# Patient Record
Sex: Male | Born: 1960 | Race: White | Hispanic: No | Marital: Single | Smoking: Current every day smoker
Health system: Southern US, Community
[De-identification: ages and names within clinical notes are randomized; demographics above are authoritative.]

## PROBLEM LIST (undated history)

## (undated) DIAGNOSIS — E7800 Pure hypercholesterolemia, unspecified: Secondary | ICD-10-CM

## (undated) HISTORY — PX: HERNIA REPAIR: SHX51

## (undated) HISTORY — PX: BACK SURGERY: SHX140

---

## 2004-11-29 ENCOUNTER — Encounter: Admission: RE | Admit: 2004-11-29 | Discharge: 2004-11-29 | Payer: Self-pay | Admitting: Occupational Medicine

## 2005-03-13 ENCOUNTER — Ambulatory Visit: Payer: Self-pay | Admitting: General Surgery

## 2009-10-02 ENCOUNTER — Ambulatory Visit: Payer: Self-pay

## 2009-10-15 ENCOUNTER — Encounter: Admission: RE | Admit: 2009-10-15 | Discharge: 2009-10-15 | Payer: Self-pay | Admitting: Unknown Physician Specialty

## 2009-11-04 ENCOUNTER — Ambulatory Visit: Payer: Self-pay | Admitting: Unknown Physician Specialty

## 2009-11-05 ENCOUNTER — Ambulatory Visit: Payer: Self-pay | Admitting: Unknown Physician Specialty

## 2010-10-29 ENCOUNTER — Ambulatory Visit: Payer: Self-pay | Admitting: Unknown Physician Specialty

## 2011-08-25 ENCOUNTER — Other Ambulatory Visit: Payer: Self-pay | Admitting: Family Medicine

## 2011-08-25 DIAGNOSIS — M5410 Radiculopathy, site unspecified: Secondary | ICD-10-CM

## 2011-08-25 DIAGNOSIS — R29898 Other symptoms and signs involving the musculoskeletal system: Secondary | ICD-10-CM

## 2011-08-31 ENCOUNTER — Ambulatory Visit
Admission: RE | Admit: 2011-08-31 | Discharge: 2011-08-31 | Disposition: A | Discharge status: Home / Self Care | Payer: BC Managed Care – PPO | Source: Ambulatory Visit | Attending: Family Medicine | Admitting: Family Medicine

## 2011-08-31 DIAGNOSIS — R29898 Other symptoms and signs involving the musculoskeletal system: Secondary | ICD-10-CM

## 2011-08-31 DIAGNOSIS — M5410 Radiculopathy, site unspecified: Secondary | ICD-10-CM

## 2011-08-31 MED ORDER — GADOBENATE DIMEGLUMINE 529 MG/ML IV SOLN
19.0000 mL | Freq: Once | INTRAVENOUS | Status: AC | PRN
  Administered 2011-08-31: 19 mL via INTRAVENOUS

## 2013-01-22 ENCOUNTER — Other Ambulatory Visit: Payer: Self-pay | Admitting: Occupational Medicine

## 2013-01-22 ENCOUNTER — Ambulatory Visit: Payer: Self-pay

## 2013-01-22 DIAGNOSIS — Z0000 Encounter for general adult medical examination without abnormal findings: Secondary | ICD-10-CM

## 2014-04-15 ENCOUNTER — Ambulatory Visit: Admit: 2014-04-15 | Disposition: A | Discharge status: Home / Self Care | Payer: Self-pay | Admitting: Otolaryngology

## 2014-04-15 LAB — URINALYSIS, COMPLETE
BACTERIA: NONE SEEN
BILIRUBIN, UR: NEGATIVE
Glucose,UR: NEGATIVE mg/dL (ref 0–75)
KETONE: NEGATIVE
Leukocyte Esterase: NEGATIVE
Nitrite: NEGATIVE
PH: 5 (ref 4.5–8.0)
Protein: NEGATIVE
RBC,UR: 1 /HPF (ref 0–5)
Specific Gravity: 1.019 (ref 1.003–1.030)
Squamous Epithelial: NONE SEEN
WBC UR: <1 /HPF (ref 0–5)

## 2014-04-15 LAB — COMPREHENSIVE METABOLIC PANEL
AST: 31 U/L
Albumin: 4.3 g/dL
Alkaline Phosphatase: 59 U/L
Anion Gap: 6 — ABNORMAL LOW (ref 7–16)
BUN: 16 mg/dL
Bilirubin,Total: 0.9 mg/dL
Calcium, Total: 9 mg/dL
Chloride: 107 mmol/L
Co2: 26 mmol/L
Creatinine: 0.84 mg/dL
EGFR (African American): >60
EGFR (Non-African Amer.): >60
Glucose: 140 mg/dL — ABNORMAL HIGH
Potassium: 3.8 mmol/L
SGPT (ALT): 33 U/L
Sodium: 139 mmol/L
Total Protein: 6.9 g/dL

## 2014-04-15 LAB — CBC
HCT: 41.7 % (ref 40.0–52.0)
HGB: 13.9 g/dL (ref 13.0–18.0)
MCH: 28.8 pg (ref 26.0–34.0)
MCHC: 33.3 g/dL (ref 32.0–36.0)
MCV: 87 fL (ref 80–100)
Platelet: 158 10*3/uL (ref 150–440)
RBC: 4.81 10*6/uL (ref 4.40–5.90)
RDW: 13.4 % (ref 11.5–14.5)
WBC: 6.3 10*3/uL (ref 3.8–10.6)

## 2014-04-15 LAB — CK TOTAL AND CKMB (NOT AT ARMC)
CK, TOTAL: 171 U/L
CK-MB: 5.7 ng/mL — ABNORMAL HIGH

## 2014-04-15 LAB — TROPONIN I: Troponin-I: <0.03 ng/mL

## 2014-04-21 ENCOUNTER — Ambulatory Visit
Admission: RE | Admit: 2014-04-21 | Discharge: 2014-04-21 | Disposition: A | Discharge status: Home / Self Care | Payer: Self-pay | Source: Ambulatory Visit | Attending: Internal Medicine | Admitting: Internal Medicine

## 2014-04-21 ENCOUNTER — Other Ambulatory Visit: Payer: Self-pay | Admitting: Internal Medicine

## 2014-04-21 DIAGNOSIS — S20219A Contusion of unspecified front wall of thorax, initial encounter: Secondary | ICD-10-CM

## 2014-05-17 NOTE — Op Note (Signed)
PATIENT NAME:  Herbert Flores, SPONAUGLE MR#:  [id number] DATE OF BIRTH:  [birth year]  DATE OF PROCEDURE:  04/15/2014  PREOPERATIVE DIAGNOSIS: Laceration and partial avulsion of the left auricle secondary to trauma.   POSTOPERATIVE DIAGNOSIS: Laceration and partial avulsion of the left auricle secondary to trauma.   OPERATIVE PROCEDURE: Complex repair of left auricular laceration and trauma.   SURGEON: Cammy Copa, MD.   ANESTHESIA: General.   COMPLICATIONS: None.  TOTAL ESTIMATED BLOOD LOSS: 25 mL.   PROCEDURE: The patient has blunt trauma to his left auricle secondary to a fall from a tree and has a through-and-through laceration and tore the skin. The skin anteriorly is quite macerated and there is avulsion of some of the anterior skin just above the earlobe. The skin has very stellate tears inferiorly and goes through the entire cartilage from the conchal bowl all the way to the superior outer auricle. The laceration involves the entire posterior side of the auricle from the left outer superior down to behind the conchal bowl inferiorly. The cartilage is shattered in several areas with small little chunks. Some of these are loose and trimmed. There was a small amount of debris and this was all washed clear. No local anesthesia was placed. Tried to figure out where the cartilage pieces went and piece things back together after he was asleep and prepped and draped in sterile fashion.   Once I could tell where the cartilage went, some 5-0 Vicryl was used in small interrupted sutures to piece the cartilage back together and hold the underlying structure of the auricle in its anatomic position. You could tell with piecing the skin back where it belonged, that there was avulsion of skin inferiorly on the wound right at the inferior border of the concha bullosa just above the earlobe. Once the cartilage was all pieced together using the 5-0 Vicryl, a skin flap was created of some of the conchal mucosa so  that I could rotate this laterally and use it to cover the defect. The 5-0 Vicryls were used to hold the tension of the skin of the flaps in position and these were used to close the dermis. The 5-0 Vicryl was used to close dermis further up the wound and to hold the outer edge of the helix together in its normal anatomic position.   Once I had this all pieced together with 5-0 Vicryl, then 6-0 Prolene was used in interrupted sutures along the advancement flap, and then in a running locking suture to hold the skin edges together along the main vertical portion of the incision from the bottom of the conchal bowl all the way up to the superior auricle. At the superior auricle, several small interrupted sutures were placed to make sure that the outer edge of the auricle looked normal, and then a running locking suture was placed on the backside of the auricle to hold the skin edges together there.  Once the wound was completely closed, it was covered with some bacitracin ointment and then Telfa. Cotton ball was placed into the conchal bowl and then a Glasscock ear dressing was placed over the auricle. A piece of gauze was placed behind the auricle to hold it along with Telfa there.  The patient tolerated the procedure well. He was awakened and taken to the recovery room in satisfactory condition. There were no operative complications.   ____________________________ Cammy Copa, MD phj:jh D: 04/15/2014 19:42:46 ET T: 04/15/2014 20:27:12 ET JOB#: [id number]  cc: Beverly Sessions.  Elenore RotaJuengel, MD, <Dictator> Cammy CopaPAUL H Shaquilla Kehres MD ELECTRONICALLY SIGNED 04/19/2014 17:46

## 2014-05-17 NOTE — H&P (Signed)
PATIENT NAME:  Herbert Flores, [name] MR#:  [id number] DATE OF BIRTH:  November 15, 1960  DATE OF ADMISSION:  04/15/2014  EMERGENCY ROOM NOTE AND HISTORY AND PHYSICAL   CHIEF COMPLAINT: Left traumatic ear laceration and partial avulsion.   HISTORY OF PRESENT ILLNESS: The patient was brought to the Emergency Room at 9:30 this morning after having a 15-foot fall. He was in a tree trying to trim a limb and fell into some mulch and dirt. He landed on his left ear with a very complex laceration and partial avulsion of the lower portion of the ear. He did not lose consciousness. He was evaluated in the Emergency Room and had x-rays of his head and neck with no injuries to his cervical spine or head. He has some contusions and pain around his chest, but no obvious fractures. He is seen for evaluation of his ear and repair.   REVIEW OF SYSTEMS: He has had some low back pain previously and some numbness down his right leg, but otherwise has no other head and neck complaints.   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 He has had back surgery previously, and he is on medication for that. He has also had hernia repair and vasectomy. He has elevated cholesterol, for which he is on medication.   CURRENT MEDICATIONS: As reviewed in the chart. He is on gabapentin, Lipitor, nabumetone, and tramadol.   DRUG ALLERGIES: None known.  SOCIAL HISTORY: He does smoke.   FAMILY HISTORY: Negative for any medical issues.   PHYSICAL EXAMINATION: GENERAL: The patient is awake and alert, remembers the incidents and seems to be a good historian.  HEENT: His left ear shows a complex laceration through and through, running tangentially from the upper 1/3 of the down through the conchal bowl and the lower half of the ear is hanging inferiorly. The cartilage is torn and has very irregular border and small pieces that are broken. The skin is torn all the way through the back of the ear, back to the postauricular  crease. The ear canal is not involved, and his hearing is okay on this side. The right ear is clear. He has a little bruising in his neck, beneath it on the left side, but no neck masses palpable, is slightly tender there. His nose is clear. His oropharynx is clear.  LUNGS: Clear to auscultation. He is a little tender at his sternum and some of his ribs when he takes a deep breath.  ABDOMEN: Benign, but somewhat tender to palpation.  HEART: Shows a regular rate and rhythm without murmur. His heart rate is about 60.   IMPRESSION: The patient has a very irregular tear/laceration of the ear where the skin and cartilage  is disrupted. There are no good sharp edges, but a very irregular wound. This will require a complex repair, and I think this is best to be done in the operating room. He has had CT scans of his head and neck that showed no signs of fracture.   PLAN: We will plan to repair this under anesthesia, once it is repaired, he should be able to be discharged home to rest at home and follow up in the office.   I have explained this to the patient, and he understands and has no further questions. Informed surgical request was signed,     ____________________________ Cammy CopaPaul H. Vihaan Gloss, MD phj:mw D: 04/15/2014 12:51:41 ET T: 04/15/2014 13:08:28 ET JOB#: [id number]  cc: Cammy CopaPaul H. Jakaylee Sasaki, MD, <Dictator> Renae FicklePAUL  Ova Freshwater MD ELECTRONICALLY SIGNED 04/19/2014 17:46

## 2015-02-01 ENCOUNTER — Encounter: Payer: Self-pay | Admitting: Endocrinology

## 2015-02-01 ENCOUNTER — Ambulatory Visit (INDEPENDENT_AMBULATORY_CARE_PROVIDER_SITE_OTHER): Payer: BLUE CROSS/BLUE SHIELD | Admitting: Endocrinology

## 2015-02-01 VITALS — BP 120/78 | HR 69 | Temp 98.2°F | Ht 74.5 in | Wt 224.0 lb

## 2015-02-01 DIAGNOSIS — E785 Hyperlipidemia, unspecified: Secondary | ICD-10-CM | POA: Insufficient documentation

## 2015-02-01 DIAGNOSIS — E0590 Thyrotoxicosis, unspecified without thyrotoxic crisis or storm: Secondary | ICD-10-CM

## 2015-02-01 LAB — LIPID PANEL
Cholesterol: 259 mg/dL — ABNORMAL HIGH (ref 0–200)
HDL: 40.6 mg/dL (ref >39.00)
NonHDL: 218.79
Total CHOL/HDL Ratio: 6
Triglycerides: 255 mg/dL — ABNORMAL HIGH (ref 0.0–149.0)
VLDL: 51 mg/dL — ABNORMAL HIGH (ref 0.0–40.0)

## 2015-02-01 LAB — T4, FREE: Free T4: 0.68 ng/dL (ref 0.60–1.60)

## 2015-02-01 LAB — TSH: TSH: 0.93 u[IU]/mL (ref 0.35–4.50)

## 2015-02-01 NOTE — Progress Notes (Signed)
Subjective:    Patient ID: Herbert Flores, male    DOB: 05/24/1960, 55 y.o.   MRN: [id number]  HPI Pt states few mos of slight tremor of the hands, and assoc fatigue.  He has never been on thyroid therapy.  He has never had XRT to the anterior neck, or thyroid surgery.  He has never had thyroid imaging.  He does not consume kelp or any other non-prescribed thyroid medication.  He has never been on amiodarone.  Pt also requests we check his lipids (PCP office was closed last week, for snow).   No past medical history on file.  No past surgical history on file.  Social History   Social History  . Marital Status: Single    Spouse Name: N/A  . Number of Children: N/A  . Years of Education: N/A   Occupational History  . Not on file.   Social History Main Topics  . Smoking status: Current Every Day Smoker  . Smokeless tobacco: Not on file  . Alcohol Use: 0.0 oz/week    0 Standard drinks or equivalent per week  . Drug Use: Not on file  . Sexual Activity: Not on file   Other Topics Concern  . Not on file   Social History Narrative  . No narrative on file    No current outpatient prescriptions on file prior to visit.   No current facility-administered medications on file prior to visit.    Allergies  Allergen Reactions  . Lipitor [Atorvastatin]     Joint Pain    Family History  Problem Relation Age of Onset  . Thyroid disease Mother   . Thyroid disease Sister     BP 120/78 mmHg  Pulse 69  Temp(Src) 98.2 F (36.8 C) (Oral)  Ht 6' 2.5" (1.892 m)  Wt 224 lb (101.606 kg)  BMI 28.38 kg/m2  SpO2 96%  Review of Systems denies headache, hoarseness, visual loss, palpitations, sob, diarrhea, polyuria, excessive diaphoresis, heat intolerance, easy bruising, and rhinorrhea. He has itching, insomnia, lightheadedness, redness of the eyes, anxiety, and myalgias.  He has lost a few lbs.      Objective:   Physical Exam VS: see vs page GEN: no distress HEAD: head: no  deformity eyes: no periorbital swelling, no proptosis external nose and ears are normal mouth: no lesion seen NECK: supple, thyroid is not enlarged CHEST WALL: no deformity LUNGS: clear to auscultation.   BREASTS:  No gynecomastia CV: reg rate and rhythm, no murmur ABD: abdomen is soft, nontender.  no hepatosplenomegaly.  not distended.  no hernia.   MUSCULOSKELETAL: muscle bulk and strength are grossly normal.  no obvious joint swelling.  gait is normal and steady EXTEMITIES: no deformity.  no edema PULSES: no carotid bruit NEURO:  cn 2-12 grossly intact.   readily moves all 4's.  sensation is intact to touch on all 4's.  SKIN:  Normal texture and temperature.  No rash or suspicious lesion is visible.   NODES:  None palpable at the neck PSYCH: alert, well-oriented.  Does not appear anxious nor depressed.  I have reviewed outside records, and summarized: Pt was noted to have suppressed TSH, and referred here.  outside test results are reviewed: TSH=0.08  Today: Lab Results  Component Value Date   TSH 0.93 02/01/2015   CXR (01/22/13): no mention is made of a goiter.     Assessment & Plan:  Hyperthyroidism, new, uncertain etiology, resolved.  I advised recheck in 1 month.  Patient is advised the following: Patient Instructions  blood tests are requested for you today.  We'll let you know about the results. If it is still high, please consider the options we discussed today, and let us know.      Hyperthyroidism Hyperthyroidism is when the thyroid is too active (overactive). Your thyroid is a large gland that is located in your neck. The thyroid helps to control how your body uses food (metabolism). When your thyroid is overactive, it produces too much of a hormone called thyroxine.  CAUSES Causes of hyperthyroidism may include:  Graves disease. This is when your immune system attacks the thyroid gland. This is the most common cause.  Inflammation of the thyroid  gland.  Tumor in the thyroid gland or somewhere else.  Excessive use of thyroid medicines, including:  Prescription thyroid supplement.  Herbal supplements that mimic thyroid hormones.  Solid or fluid-filled lumps within your thyroid gland (thyroid nodules).  Excessive ingestion of iodine. RISK FACTORS  Being male.  Having a family history of thyroid conditions. SIGNS AND SYMPTOMS Signs and symptoms of hyperthyroidism may include:  Nervousness.  Inability to tolerate heat.  Unexplained weight loss.  Diarrhea.  Change in the texture of hair or skin.  Heart skipping beats or making extra beats.  Rapid heart rate.  Loss of menstruation.  Shaky hands.  Fatigue.  Restlessness.  Increased appetite.  Sleep problems.  Enlarged thyroid gland or nodules. DIAGNOSIS  Diagnosis of hyperthyroidism may include:  Medical history and physical exam.  Blood tests.  Ultrasound tests. TREATMENT Treatment may include:  Medicines to control your thyroid.  Surgery to remove your thyroid.  Radiation therapy. HOME CARE INSTRUCTIONS   Take medicines only as directed by your health care provider.  Do not use any tobacco products, including cigarettes, chewing tobacco, or electronic cigarettes. If you need help quitting, ask your health care provider.  Do not exercise or do physical activity until your health care provider approves.  Keep all follow-up appointments as directed by your health care provider. This is important. SEEK MEDICAL CARE IF:  Your symptoms do not get better with treatment.  You have fever.  You are taking thyroid replacement medicine and you:  Have depression.  Feel mentally and physically slow.  Have weight gain. SEEK IMMEDIATE MEDICAL CARE IF:   You have decreased alertness or a change in your awareness.  You have abdominal pain.  You feel dizzy.  You have a rapid heartbeat.  You have an irregular heartbeat.   This  information is not intended to replace advice given to you by your health care provider. Make sure you discuss any questions you have with your health care provider.   Document Released: 01/02/2005 Document Revised: 01/23/2014 Document Reviewed: 05/20/2013 Elsevier Interactive Patient Education [employer]

## 2015-02-01 NOTE — Patient Instructions (Signed)
blood tests are requested for you today.  We'll let you know about the results. If it is still high, please consider the options we discussed today, and let us know.      Hyperthyroidism Hyperthyroidism is when the thyroid is too active (overactive). Your thyroid is a large gland that is located in your neck. The thyroid helps to control how your body uses food (metabolism). When your thyroid is overactive, it produces too much of a hormone called thyroxine.  CAUSES Causes of hyperthyroidism may include:  Graves disease. This is when your immune system attacks the thyroid gland. This is the most common cause.  Inflammation of the thyroid gland.  Tumor in the thyroid gland or somewhere else.  Excessive use of thyroid medicines, including:  Prescription thyroid supplement.  Herbal supplements that mimic thyroid hormones.  Solid or fluid-filled lumps within your thyroid gland (thyroid nodules).  Excessive ingestion of iodine. RISK FACTORS  Being male.  Having a family history of thyroid conditions. SIGNS AND SYMPTOMS Signs and symptoms of hyperthyroidism may include:  Nervousness.  Inability to tolerate heat.  Unexplained weight loss.  Diarrhea.  Change in the texture of hair or skin.  Heart skipping beats or making extra beats.  Rapid heart rate.  Loss of menstruation.  Shaky hands.  Fatigue.  Restlessness.  Increased appetite.  Sleep problems.  Enlarged thyroid gland or nodules. DIAGNOSIS  Diagnosis of hyperthyroidism may include:  Medical history and physical exam.  Blood tests.  Ultrasound tests. TREATMENT Treatment may include:  Medicines to control your thyroid.  Surgery to remove your thyroid.  Radiation therapy. HOME CARE INSTRUCTIONS   Take medicines only as directed by your health care provider.  Do not use any tobacco products, including cigarettes, chewing tobacco, or electronic cigarettes. If you need help quitting, ask your  health care provider.  Do not exercise or do physical activity until your health care provider approves.  Keep all follow-up appointments as directed by your health care provider. This is important. SEEK MEDICAL CARE IF:  Your symptoms do not get better with treatment.  You have fever.  You are taking thyroid replacement medicine and you:  Have depression.  Feel mentally and physically slow.  Have weight gain. SEEK IMMEDIATE MEDICAL CARE IF:   You have decreased alertness or a change in your awareness.  You have abdominal pain.  You feel dizzy.  You have a rapid heartbeat.  You have an irregular heartbeat.   This information is not intended to replace advice given to you by your health care provider. Make sure you discuss any questions you have with your health care provider.   Document Released: 01/02/2005 Document Revised: 01/23/2014 Document Reviewed: 05/20/2013 Elsevier Interactive Patient Education [employer]

## 2015-02-02 ENCOUNTER — Encounter: Payer: Self-pay | Admitting: Endocrinology

## 2015-02-02 LAB — LDL CHOLESTEROL, DIRECT: Direct LDL: 183 mg/dL

## 2015-03-01 ENCOUNTER — Other Ambulatory Visit (INDEPENDENT_AMBULATORY_CARE_PROVIDER_SITE_OTHER): Payer: BLUE CROSS/BLUE SHIELD

## 2015-03-01 DIAGNOSIS — E785 Hyperlipidemia, unspecified: Secondary | ICD-10-CM | POA: Diagnosis not present

## 2015-03-01 DIAGNOSIS — E0590 Thyrotoxicosis, unspecified without thyrotoxic crisis or storm: Secondary | ICD-10-CM | POA: Diagnosis not present

## 2015-03-01 LAB — TSH: TSH: 0.62 u[IU]/mL (ref 0.35–4.50)

## 2015-03-01 LAB — T4, FREE: FREE T4: 0.8 ng/dL (ref 0.60–1.60)

## 2015-06-07 DIAGNOSIS — S83242A Other tear of medial meniscus, current injury, left knee, initial encounter: Secondary | ICD-10-CM | POA: Diagnosis not present

## 2015-07-01 DIAGNOSIS — S83242D Other tear of medial meniscus, current injury, left knee, subsequent encounter: Secondary | ICD-10-CM | POA: Diagnosis not present

## 2015-10-29 ENCOUNTER — Ambulatory Visit: Payer: Self-pay

## 2015-10-29 ENCOUNTER — Other Ambulatory Visit: Payer: Self-pay | Admitting: Occupational Medicine

## 2015-10-29 DIAGNOSIS — Z0000 Encounter for general adult medical examination without abnormal findings: Secondary | ICD-10-CM

## 2015-12-13 DIAGNOSIS — Z125 Encounter for screening for malignant neoplasm of prostate: Secondary | ICD-10-CM | POA: Diagnosis not present

## 2015-12-13 DIAGNOSIS — Z1159 Encounter for screening for other viral diseases: Secondary | ICD-10-CM | POA: Diagnosis not present

## 2015-12-13 DIAGNOSIS — R5383 Other fatigue: Secondary | ICD-10-CM | POA: Diagnosis not present

## 2015-12-13 DIAGNOSIS — E7800 Pure hypercholesterolemia, unspecified: Secondary | ICD-10-CM | POA: Diagnosis not present

## 2015-12-13 DIAGNOSIS — Z0000 Encounter for general adult medical examination without abnormal findings: Secondary | ICD-10-CM | POA: Diagnosis not present

## 2015-12-13 DIAGNOSIS — R946 Abnormal results of thyroid function studies: Secondary | ICD-10-CM | POA: Diagnosis not present

## 2015-12-13 DIAGNOSIS — M519 Unspecified thoracic, thoracolumbar and lumbosacral intervertebral disc disorder: Secondary | ICD-10-CM | POA: Diagnosis not present

## 2016-05-26 IMAGING — CT CT HEAD WITHOUT CONTRAST
3 of 5 series · 15 of 47 positions shown, 18 images · non-contrast
Comparison: None.

CLINICAL DATA: Fall 15 feet from a tree. Laceration along the left
ear.

EXAM:
CT HEAD WITHOUT CONTRAST
CT CERVICAL SPINE WITHOUT CONTRAST
TECHNIQUE: Multidetector CT imaging of the head and cervical spine was
performed following the standard protocol without intravenous
contrast. Multiplanar CT image reconstructions of the cervical spine
were also generated.

[Series 8: sag bone · sagittal · 0.29mm/px · 3 of 76 slices shown]
[im 26/76  brain]
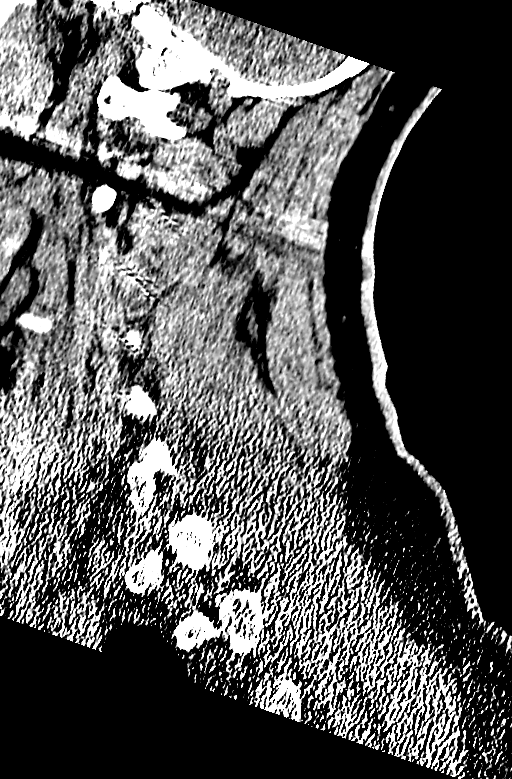
[im 38/76  brain]
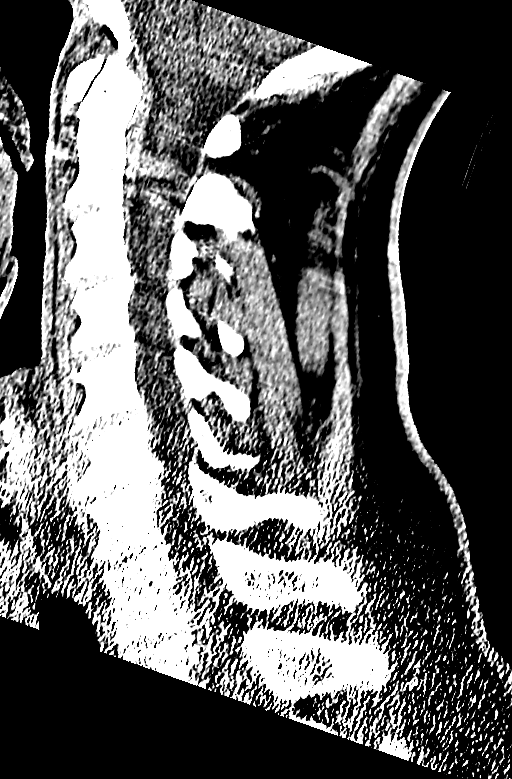
[im 51/76  brain]
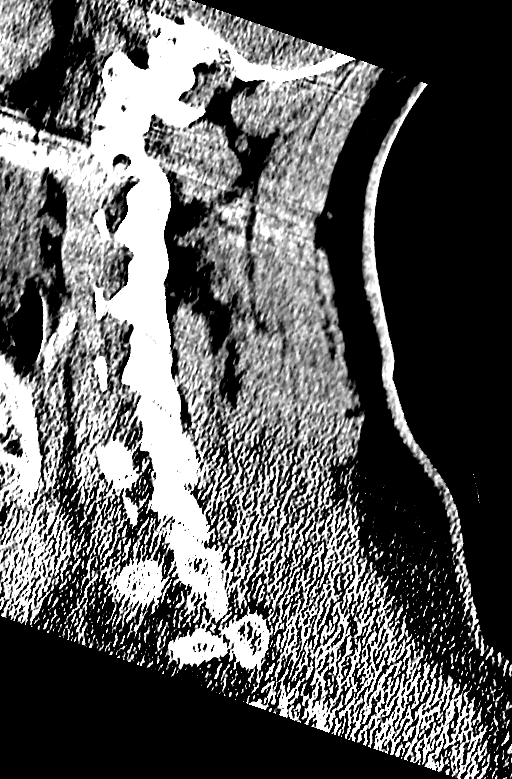

[Series 9: cor bone · coronal · 0.39mm/px · 3 of 71 slices shown]
[im 24/71  brain]
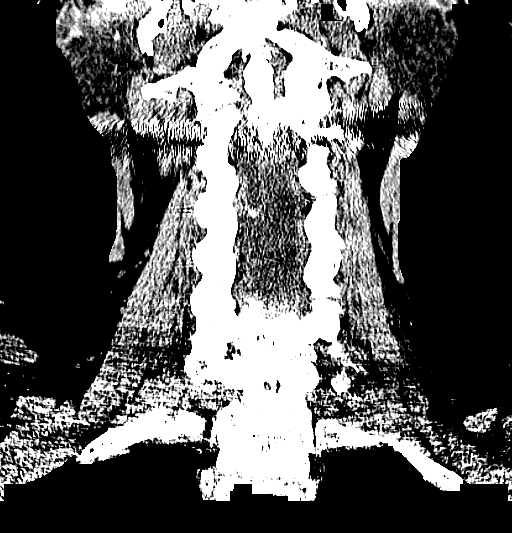
[im 32/71  brain]
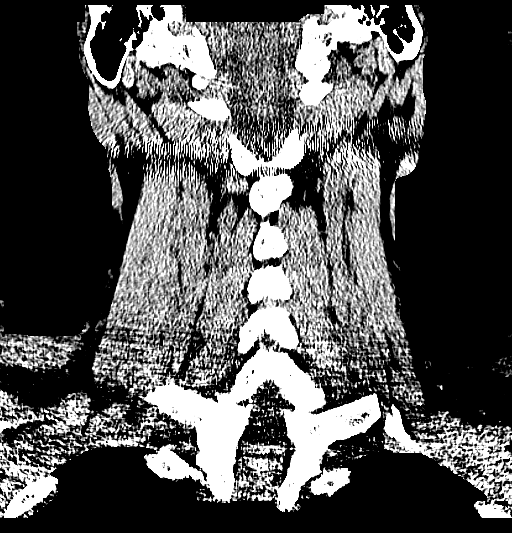
[im 39/71  brain]
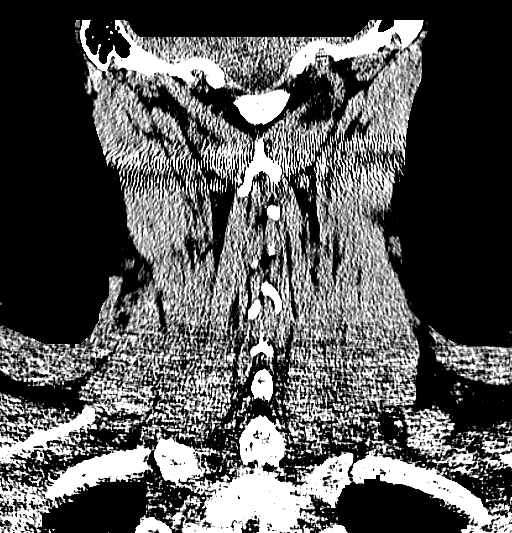

[Series 10: orthogonal axials · axial · 0.27mm/px · z∈[-379,-229]mm · 9 of 100 slices shown, 12 images]
[im 9/100  brain]
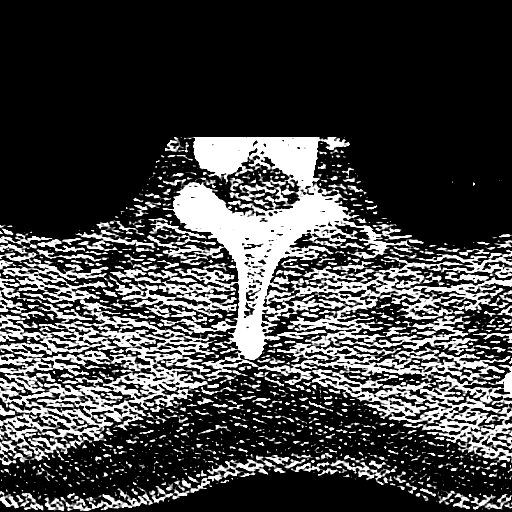
[im 9/100  bone]
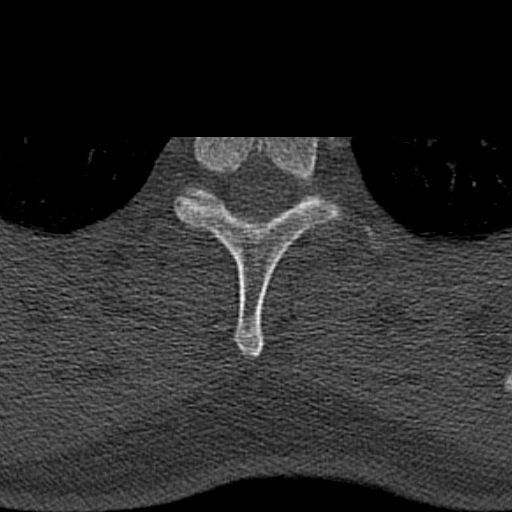
[im 17/100  brain]
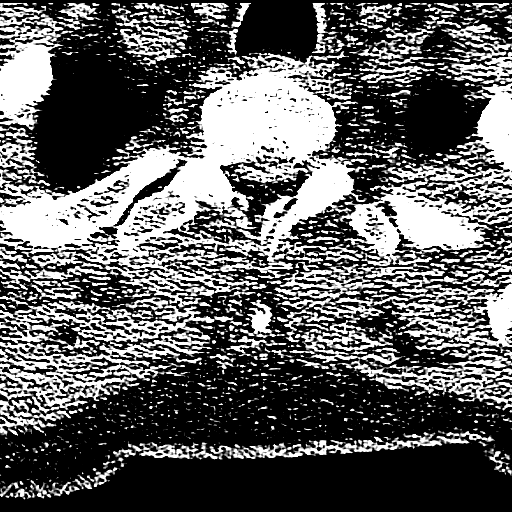
[im 34/100  brain]
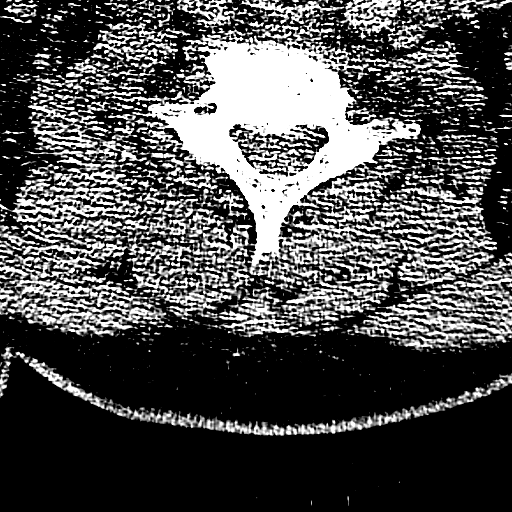
[im 42/100  brain]
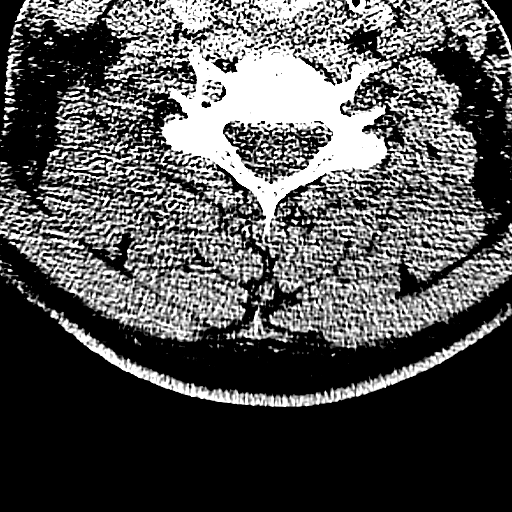
[im 50/100  brain]
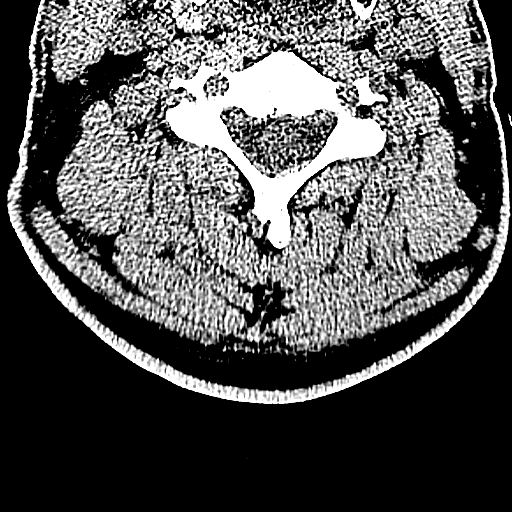
[im 50/100  bone]
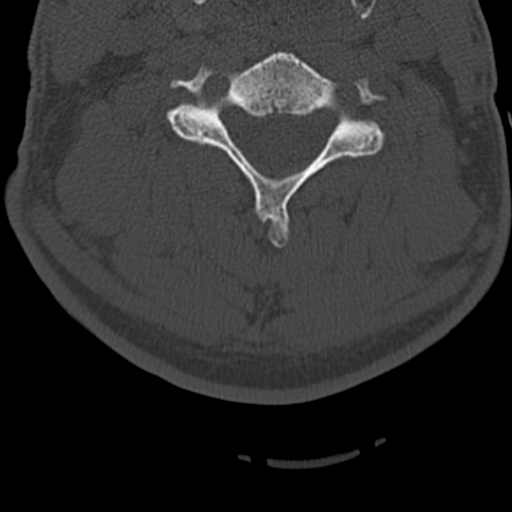
[im 58/100  brain]
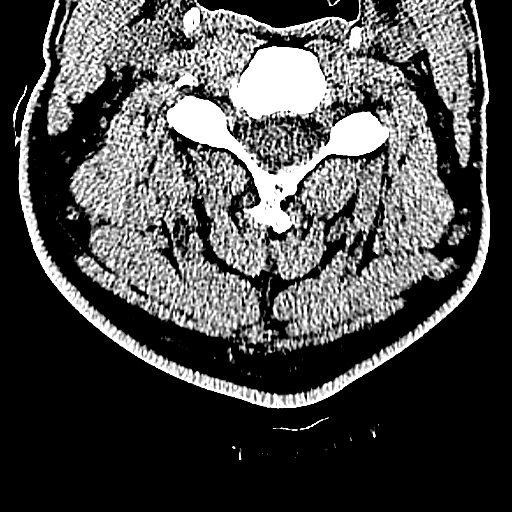
[im 67/100  brain]
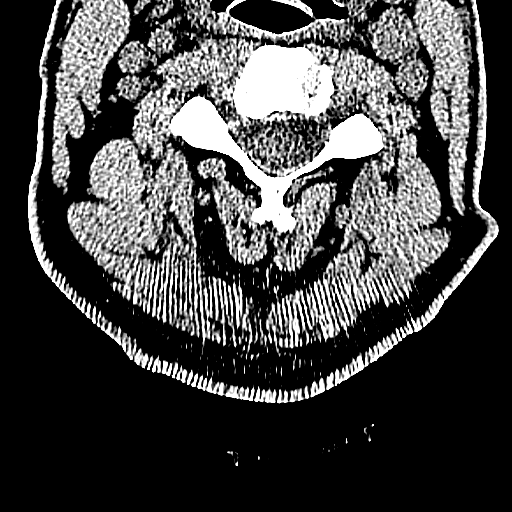
[im 83/100  brain]
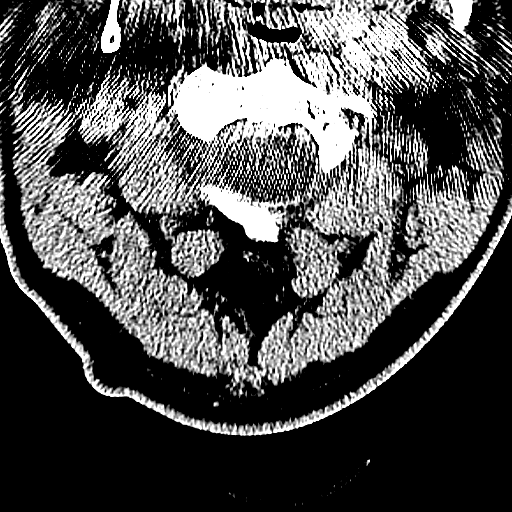
[im 91/100  brain]
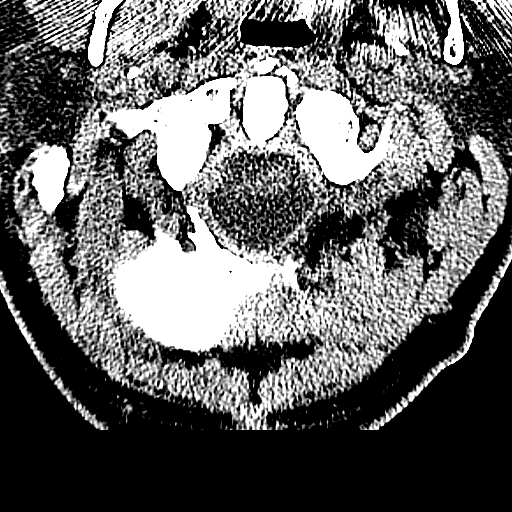
[im 91/100  bone]
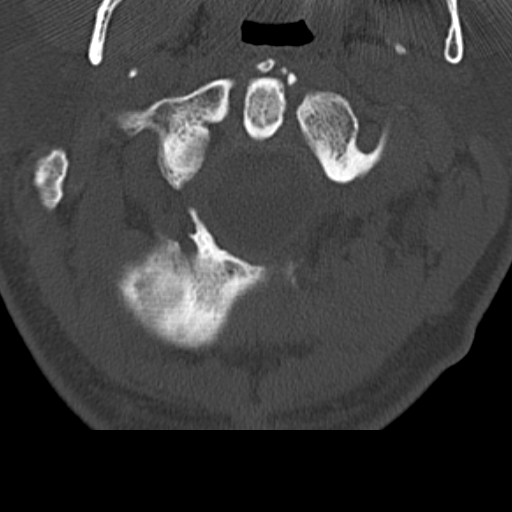

[15 of 47 positions shown; findings below may reference images not displayed]

FINDINGS: CT HEAD FINDINGS

The brainstem, cerebellum, cerebral peduncles, thalamus, basal
ganglia, basilar cisterns, and ventricular system appear within
normal limits. No intracranial hemorrhage, mass lesion, or acute
CVA.

Considerable chronic bilateral ethmoid sinusitis with bilateral
chronic frontal and sphenoid sinusitis.

CT CERVICAL SPINE FINDINGS

Posterior osseous ridging, uncinate spurring, and facet arthropathy
cause moderate to prominent left and moderate right foraminal
stenosis at C6-7.

No cervical spine fracture or subluxation is identified.
IMPRESSION: 1. No acute intracranial abnormality or acute cervical spine
abnormality.
2. Facet than uncinate spurring cause prominent left and moderate
right foraminal stenosis at C6-7.
3. Chronic bilateral frontal, sphenoid, and ethmoid sinusitis.

## 2017-12-17 DIAGNOSIS — E7800 Pure hypercholesterolemia, unspecified: Secondary | ICD-10-CM | POA: Diagnosis not present

## 2017-12-17 DIAGNOSIS — Z0000 Encounter for general adult medical examination without abnormal findings: Secondary | ICD-10-CM | POA: Diagnosis not present

## 2017-12-17 DIAGNOSIS — N200 Calculus of kidney: Secondary | ICD-10-CM | POA: Diagnosis not present

## 2017-12-17 DIAGNOSIS — Z125 Encounter for screening for malignant neoplasm of prostate: Secondary | ICD-10-CM | POA: Diagnosis not present

## 2019-04-16 DIAGNOSIS — E7800 Pure hypercholesterolemia, unspecified: Secondary | ICD-10-CM | POA: Diagnosis not present

## 2019-04-16 DIAGNOSIS — N529 Male erectile dysfunction, unspecified: Secondary | ICD-10-CM | POA: Diagnosis not present

## 2019-04-16 DIAGNOSIS — M519 Unspecified thoracic, thoracolumbar and lumbosacral intervertebral disc disorder: Secondary | ICD-10-CM | POA: Diagnosis not present

## 2019-04-16 DIAGNOSIS — Z125 Encounter for screening for malignant neoplasm of prostate: Secondary | ICD-10-CM | POA: Diagnosis not present

## 2019-04-16 DIAGNOSIS — Z1389 Encounter for screening for other disorder: Secondary | ICD-10-CM | POA: Diagnosis not present

## 2019-04-16 DIAGNOSIS — Z0000 Encounter for general adult medical examination without abnormal findings: Secondary | ICD-10-CM | POA: Diagnosis not present

## 2020-09-03 ENCOUNTER — Other Ambulatory Visit: Payer: Self-pay

## 2020-09-03 ENCOUNTER — Ambulatory Visit
Admission: RE | Admit: 2020-09-03 | Discharge: 2020-09-03 | Disposition: A | Discharge status: Home / Self Care | Payer: BC Managed Care – PPO | Source: Ambulatory Visit | Attending: Internal Medicine | Admitting: Internal Medicine

## 2020-09-03 ENCOUNTER — Other Ambulatory Visit: Payer: Self-pay | Admitting: Internal Medicine

## 2020-09-03 DIAGNOSIS — E7800 Pure hypercholesterolemia, unspecified: Secondary | ICD-10-CM | POA: Diagnosis not present

## 2020-09-03 DIAGNOSIS — M533 Sacrococcygeal disorders, not elsewhere classified: Secondary | ICD-10-CM | POA: Diagnosis not present

## 2020-09-03 DIAGNOSIS — R059 Cough, unspecified: Secondary | ICD-10-CM | POA: Diagnosis not present

## 2020-09-03 DIAGNOSIS — Z23 Encounter for immunization: Secondary | ICD-10-CM | POA: Diagnosis not present

## 2020-09-03 DIAGNOSIS — Z125 Encounter for screening for malignant neoplasm of prostate: Secondary | ICD-10-CM | POA: Diagnosis not present

## 2020-09-03 DIAGNOSIS — Z1389 Encounter for screening for other disorder: Secondary | ICD-10-CM | POA: Diagnosis not present

## 2020-09-03 DIAGNOSIS — Z5181 Encounter for therapeutic drug level monitoring: Secondary | ICD-10-CM | POA: Diagnosis not present

## 2020-09-03 DIAGNOSIS — M519 Unspecified thoracic, thoracolumbar and lumbosacral intervertebral disc disorder: Secondary | ICD-10-CM | POA: Diagnosis not present

## 2020-09-03 DIAGNOSIS — Z0000 Encounter for general adult medical examination without abnormal findings: Secondary | ICD-10-CM | POA: Diagnosis not present

## 2020-11-16 DIAGNOSIS — Z23 Encounter for immunization: Secondary | ICD-10-CM | POA: Diagnosis not present

## 2021-08-18 DIAGNOSIS — H524 Presbyopia: Secondary | ICD-10-CM | POA: Diagnosis not present

## 2021-08-18 DIAGNOSIS — H52223 Regular astigmatism, bilateral: Secondary | ICD-10-CM | POA: Diagnosis not present

## 2021-08-18 DIAGNOSIS — H2513 Age-related nuclear cataract, bilateral: Secondary | ICD-10-CM | POA: Diagnosis not present

## 2021-08-18 DIAGNOSIS — H5203 Hypermetropia, bilateral: Secondary | ICD-10-CM | POA: Diagnosis not present

## 2021-08-18 DIAGNOSIS — H35371 Puckering of macula, right eye: Secondary | ICD-10-CM | POA: Diagnosis not present

## 2021-09-08 ENCOUNTER — Other Ambulatory Visit: Payer: Self-pay | Admitting: Internal Medicine

## 2021-09-08 DIAGNOSIS — F17210 Nicotine dependence, cigarettes, uncomplicated: Secondary | ICD-10-CM | POA: Diagnosis not present

## 2021-09-08 DIAGNOSIS — M519 Unspecified thoracic, thoracolumbar and lumbosacral intervertebral disc disorder: Secondary | ICD-10-CM | POA: Diagnosis not present

## 2021-09-08 DIAGNOSIS — Z5181 Encounter for therapeutic drug level monitoring: Secondary | ICD-10-CM | POA: Diagnosis not present

## 2021-09-08 DIAGNOSIS — Z0000 Encounter for general adult medical examination without abnormal findings: Secondary | ICD-10-CM | POA: Diagnosis not present

## 2021-09-08 DIAGNOSIS — E7800 Pure hypercholesterolemia, unspecified: Secondary | ICD-10-CM | POA: Diagnosis not present

## 2021-09-08 DIAGNOSIS — N529 Male erectile dysfunction, unspecified: Secondary | ICD-10-CM | POA: Diagnosis not present

## 2021-09-08 DIAGNOSIS — Z125 Encounter for screening for malignant neoplasm of prostate: Secondary | ICD-10-CM | POA: Diagnosis not present

## 2021-09-24 ENCOUNTER — Emergency Department (HOSPITAL_COMMUNITY): Payer: BC Managed Care – PPO

## 2021-09-24 ENCOUNTER — Other Ambulatory Visit: Payer: Self-pay

## 2021-09-24 ENCOUNTER — Encounter (HOSPITAL_COMMUNITY): Payer: Self-pay

## 2021-09-24 ENCOUNTER — Emergency Department (HOSPITAL_COMMUNITY)
Admission: EM | Admit: 2021-09-24 | Discharge: 2021-09-24 | Disposition: A | Discharge status: Home / Self Care | Payer: BC Managed Care – PPO | Attending: Emergency Medicine | Admitting: Emergency Medicine

## 2021-09-24 DIAGNOSIS — R209 Unspecified disturbances of skin sensation: Secondary | ICD-10-CM | POA: Diagnosis not present

## 2021-09-24 DIAGNOSIS — M9963 Osseous and subluxation stenosis of intervertebral foramina of lumbar region: Secondary | ICD-10-CM | POA: Insufficient documentation

## 2021-09-24 DIAGNOSIS — M5442 Lumbago with sciatica, left side: Secondary | ICD-10-CM | POA: Insufficient documentation

## 2021-09-24 DIAGNOSIS — M48061 Spinal stenosis, lumbar region without neurogenic claudication: Secondary | ICD-10-CM

## 2021-09-24 DIAGNOSIS — M545 Low back pain, unspecified: Secondary | ICD-10-CM | POA: Diagnosis not present

## 2021-09-24 HISTORY — DX: Pure hypercholesterolemia, unspecified: E78.00

## 2021-09-24 LAB — CBC WITH DIFFERENTIAL/PLATELET
Abs Immature Granulocytes: 0.03 10*3/uL (ref 0.00–0.07)
Basophils Absolute: 0 10*3/uL (ref 0.0–0.1)
Basophils Relative: 0 %
Eosinophils Absolute: 0.2 10*3/uL (ref 0.0–0.5)
Eosinophils Relative: 2 %
HCT: 48.1 % (ref 39.0–52.0)
Hemoglobin: 16 g/dL (ref 13.0–17.0)
Immature Granulocytes: 0 %
Lymphocytes Relative: 23 %
Lymphs Abs: 2.2 10*3/uL (ref 0.7–4.0)
MCH: 30.1 pg (ref 26.0–34.0)
MCHC: 33.3 g/dL (ref 30.0–36.0)
MCV: 90.4 fL (ref 80.0–100.0)
Monocytes Absolute: 0.7 10*3/uL (ref 0.1–1.0)
Monocytes Relative: 7 %
Neutro Abs: 6.5 10*3/uL (ref 1.7–7.7)
Neutrophils Relative %: 68 %
Platelets: 147 10*3/uL — ABNORMAL LOW (ref 150–400)
RBC: 5.32 MIL/uL (ref 4.22–5.81)
RDW: 13.1 % (ref 11.5–15.5)
WBC: 9.6 10*3/uL (ref 4.0–10.5)
nRBC: 0 % (ref 0.0–0.2)

## 2021-09-24 LAB — BASIC METABOLIC PANEL
Anion gap: 11 (ref 5–15)
BUN: 25 mg/dL — ABNORMAL HIGH (ref 8–23)
CO2: 25 mmol/L (ref 22–32)
Calcium: 8.9 mg/dL (ref 8.9–10.3)
Chloride: 103 mmol/L (ref 98–111)
Creatinine, Ser: 0.73 mg/dL (ref 0.61–1.24)
GFR, Estimated: >60 mL/min (ref >60)
Glucose, Bld: 89 mg/dL (ref 70–99)
Potassium: 4.3 mmol/L (ref 3.5–5.1)
Sodium: 139 mmol/L (ref 135–145)

## 2021-09-24 MED ORDER — OXYCODONE-ACETAMINOPHEN 5-325 MG PO TABS: 1.0000 | ORAL_TABLET | Freq: Four times a day (QID) | ORAL | 0 refills | Status: AC | PRN

## 2021-09-24 MED ORDER — OXYCODONE-ACETAMINOPHEN 5-325 MG PO TABS
1.0000 | ORAL_TABLET | ORAL | Status: AC | PRN
  Administered 2021-09-24 (×2): 1 via ORAL
  Filled 2021-09-24 (×2): qty 1

## 2021-09-24 MED ORDER — GADOBUTROL 1 MMOL/ML IV SOLN
10.0000 mL | Freq: Once | INTRAVENOUS | Status: AC | PRN
  Administered 2021-09-24: 10 mL via INTRAVENOUS

## 2021-09-24 MED ORDER — PREDNISONE 10 MG (21) PO TBPK: ORAL_TABLET | Freq: Every day | ORAL | 0 refills | Status: AC

## 2021-09-24 NOTE — ED Triage Notes (Signed)
Complains of back pain x 2.5 weeks.  Patient has been on flexeril with no relief and now top of left thigh is numb.  Patient now having difficulty sitting, lying and walking.  Reports previous back surgeries.  Denies bowel or bladder loss.

## 2021-09-24 NOTE — ED Notes (Signed)
Patient transported to MRI 

## 2021-09-24 NOTE — ED Provider Notes (Signed)
[facility] EMERGENCY DEPARTMENT Provider Note   CSN: [id number] Arrival date & time: 09/24/21  1238    History  Chief Complaint  Patient presents with   Back Pain    VA BROADWELL is a 61 y.o. male  Chronic lower back pain, numbness to right foot on Neurontin, s/p prior back surgery here for evaluation of low back pain. Seen by PCP given Flexeril 2.5 weeks ago, described as aching then. No new trauma or injury. Pain worsening yesterday into today. Needed help up. Left thigh and foot numbness. No left foot drop. No hx of IVDU, bowel or bladder incontinence saddle, anesthesia. No fever, abd pain, CP, SOB, LE swelling, redness. Given percocet by Triage which helped with pain.  Previously seen by Dr. Newell Coral with [facility]   HPI     Home Medications Prior to Admission medications   Medication Sig Start Date End Date Taking? Authorizing Provider  oxyCODONE-acetaminophen (PERCOCET/ROXICET) 5-325 MG tablet Take 1-2 tablets by mouth every 6 (six) hours as needed for severe pain. 09/24/21  Yes Maryjo Ragon A, PA-C  predniSONE (STERAPRED UNI-PAK 21 TAB) 10 MG (21) TBPK tablet Take by mouth daily. Take 6 tabs by mouth daily  for 1 days, then 5 tabs for 1 days, then 4 tabs for 1 days, then 3 tabs for 1 days, 2 tabs for 1 days, then 1 tab by mouth daily for 1 days 09/24/21  Yes Christa Fasig A, PA-C  cholecalciferol (VITAMIN D) 1000 units tablet Take 1,000 Units by mouth 2 (two) times daily.    [provider]  gabapentin (NEURONTIN) 300 MG capsule Take 300 mg by mouth 3 (three) times daily.    [provider]  Multiple Vitamins-Minerals (MULTI ADULT GUMMIES PO) Take by mouth.    [provider]  nabumetone (RELAFEN) 500 MG tablet Take 500 mg by mouth 2 (two) times daily.    [provider]  pravastatin (PRAVACHOL) 20 MG tablet Take 20 mg by mouth daily.    [provider]  ranitidine (ZANTAC) 150 MG tablet Take  150 mg by mouth 2 (two) times daily.    [provider]  traMADol (ULTRAM) 50 MG tablet Take by mouth every 6 (six) hours as needed.    [provider]      Allergies    Lipitor [atorvastatin]    Review of Systems   Review of Systems  Constitutional: Negative.   HENT: Negative.    Respiratory: Negative.    Cardiovascular: Negative.   Gastrointestinal: Negative.   Genitourinary: Negative.   Musculoskeletal:  Positive for back pain.  Skin: Negative.   Neurological: Negative.   All other systems reviewed and are negative.   Physical Exam Updated Vital Signs BP 116/70 (BP Location: Right Arm)   Pulse (!) 56   Temp 97.8 F (36.6 C) (Oral)   Resp 16   Ht 6\' 2"  (1.88 m)   Wt 101.6 kg   SpO2 100%   BMI 28.76 kg/m  Physical Exam Vitals and nursing note reviewed.  Constitutional:      General: He is not in acute distress.    Appearance: He is well-developed. He is not ill-appearing, toxic-appearing or diaphoretic.  HENT:     Head: Normocephalic and atraumatic.  Eyes:     Pupils: Pupils are equal, round, and reactive to light.  Cardiovascular:     Rate and Rhythm: Normal rate and regular rhythm.  Pulmonary:     Effort: Pulmonary effort is  normal. No respiratory distress.     Breath sounds: Normal breath sounds.  Abdominal:     General: Bowel sounds are normal. There is no distension.     Palpations: Abdomen is soft.  Musculoskeletal:        General: Normal range of motion.     Cervical back: Normal range of motion and neck supple.     Comments: Reproducible tenderness midline left paraspinal lumbar region.  Old midline surgical incision without erythema or warmth.  Negative straight leg raise bilaterally.  Compartments soft.  No shortening or rotation of legs. No bony tenderness to BLE.  Skin:    General: Skin is warm and dry.     Capillary Refill: Capillary refill takes less than 2 seconds.     Comments: No edema, erythema, warmth, fluctuance or  induration.  Neurological:     General: No focal deficit present.     Mental Status: He is alert and oriented to person, place, and time.     Comments: Equal strength BIL. Intact sensation, equal BIL Ambulatory     ED Results / Procedures / Treatments   Labs (all labs ordered are listed, but only abnormal results are displayed) Labs Reviewed  CBC WITH DIFFERENTIAL/PLATELET - Abnormal; Notable for the following components:      Result Value   Platelets 147 (*)    All other components within normal limits  BASIC METABOLIC PANEL - Abnormal; Notable for the following components:   BUN 25 (*)    All other components within normal limits    EKG None  Radiology MR Lumbar Spine W Wo Contrast  Result Date: 09/24/2021 CLINICAL DATA:  Back pain, difficulty sitting, lying, and walking EXAM: MRI LUMBAR SPINE WITHOUT AND WITH CONTRAST TECHNIQUE: Multiplanar and multiecho pulse sequences of the lumbar spine were obtained without and with intravenous contrast. CONTRAST:  30mL GADAVIST GADOBUTROL 1 MMOL/ML IV SOLN COMPARISON:  08/28/2013 MRI lumbar spine FINDINGS: Segmentation:  5 lumbar-type vertebral bodies. Alignment:  Mild levocurvature.  No listhesis. Vertebrae: No acute fracture or suspicious osseous lesion. No abnormal osseous enhancement. Conus medullaris and cauda equina: Conus extends to the L1 level. Conus and cauda equina appear normal. Paraspinal and other soft tissues: T2 hyperintense signal and enhancement in the interspinous ligament between the L3 and L4 spinous processes (series 10, image 9 and series 6, image 9), which is new from the prior exam and can be seen with Baastrup disease. Disc levels: T12-L1: No significant disc bulge. No spinal canal stenosis or neural foraminal narrowing. L1-L2: Disc desiccation and mild disc bulge. No spinal canal stenosis or neural foraminal narrowing. L2-L3: Disc desiccation and mild disc bulge with superimposed left foraminal extrusion with 12 mm of  cranial migration (series 5, image 10 and series 8, image 13), which is new from the prior exam. In the axial plane, this measures up to 17 mm in transverse dimension, extending from the subarticular to extreme lateral region. This contacts the descending left L3 nerve roots and exiting left L2 nerve. No spinal canal stenosis. Moderate left neural foraminal narrowing. L3-L4: Disc desiccation and minimal disc bulge with left extreme lateral annular fissure. Mild facet arthropathy. No spinal canal stenosis. No neural foraminal narrowing. L4-L5: Mild disc bulge with central annular fissure. Moderate facet arthropathy. No spinal canal stenosis. Mild left neural foraminal narrowing. L5-S1: Prior right laminectomy. Mild disc bulge with central protrusion with 6 mm of cranial migration and annular fissure. The lateral aspect of the disc may contact the exiting  right L5 nerve. Mild-to-moderate facet arthropathy. No spinal canal stenosis. Mild bilateral neural foraminal narrowing, which have progressed from the prior exam IMPRESSION: 1. L2-L3 left foraminal disc extrusion with cranial migration, which is new from the prior exam. This contacts the exiting left L2 nerve and descending left L3 nerve roots, as well as causing moderate left neural foraminal narrowing. 2. L5-S1 mild bilateral neural foraminal narrowing. The lateral aspect of the disc bulge at this level may contact the exiting right L5 nerve. 3. L4-L5 mild left neural foraminal narrowing. 4. Edema in the interspinous ligament between the L3 and L4 spinous processes, as can be seen with Baastrup disease. Electronically Signed   By: Wiliam Ke M.D.   On: 09/24/2021 20:58   DG Lumbar Spine Complete  Result Date: 09/24/2021 CLINICAL DATA:  2.5 weeks of back pain. EXAM: LUMBAR SPINE - COMPLETE 4+ VIEW COMPARISON:  CT April 15, 2014 and MRI lumbar spine August 28, 2013. 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  There is no evidence of acute lumbar spine fracture. Alignment is normal. Slightly increased lower lumbar predominant mild-to-moderate degenerative changes spine. IMPRESSION: 1. No acute osseous abnormality. 2. 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 3. Slightly increased lower lumbar predominant mild-to-moderate degenerative changes spine. Electronically Signed   By: Maudry Mayhew M.D.   On: 09/24/2021 14:24    Procedures Procedures    Medications Ordered in ED Medications  oxyCODONE-acetaminophen (PERCOCET/ROXICET) 5-325 MG per tablet 1 tablet (1 tablet Oral Given 09/24/21 1928)  gadobutrol (GADAVIST) 1 MMOL/ML injection 10 mL (10 mLs Intravenous Contrast Given 09/24/21 2034)    ED Course/ Medical Decision Making/ A&P     61 year old here for evaluation of back pain.  Initially had some aching 2 and half weeks ago seen by Dr. Newell Coral started on Flexeril.  Pain worsened over the last 24 hours, needing help today.  Reports prior back surgery with no changes to his right leg.  No history of IV drug use, bowel or bladder incontinence, saddle paresthesia.  Pain reproducible on exam.  I discussed symptomatic management versus further imaging.  Patient prefers MRI, given length of time of symptoms will obtain here.  Given prior back surgery will perform with and without contrast.  Labs and imaging personally viewed and interpreted:  CBC without leukocytosis BMP BUN 25 MRI lumbar L2/L3 disc extrusion on left new from prior exam foraminal stenosis  Discussed results with patient and family.  His pain is controlled with p.o. medication.  Will start on short course of steroids as well as write for some pain medication outpatient.  I encouraged him to follow-up outpatient with [facility] whom he has been followed for previously for definitive management which she is agreeable for.  Low suspicion for acute neurosurgical emergency requiring inpatient admission or emergent surgical  intervention.  The patient has been appropriately medically screened and/or stabilized in the ED. I have low suspicion for any other emergent medical condition which would require further screening, evaluation or treatment in the ED or require inpatient management.  Patient is hemodynamically stable and in no acute distress.  Patient able to ambulate in department prior to ED.  Evaluation does not show acute pathology that would require ongoing or additional emergent interventions while in the emergency department or further inpatient treatment.  I have discussed the diagnosis with the patient and answered all questions.  Pain is been managed while in the emergency department and patient has no further  complaints prior to discharge.  Patient is comfortable with plan discussed in room and is stable for discharge at this time.  I have discussed strict return precautions for returning to the emergency department.  Patient was encouraged to follow-up with PCP/specialist refer to at discharge.                            Medical Decision Making Amount and/or Complexity of Data Reviewed Independent Historian: spouse External Data Reviewed: labs, radiology and notes. Labs: ordered. Decision-making details documented in ED Course. Radiology: ordered and independent interpretation performed. Decision-making details documented in ED Course.  Risk OTC drugs. Prescription drug management. Decision regarding hospitalization. Diagnosis or treatment significantly limited by social determinants of health.           Final Clinical Impression(s) / ED Diagnoses Final diagnoses:  Acute left-sided low back pain with left-sided sciatica  Lumbar foraminal stenosis    Rx / DC Orders ED Discharge Orders          Ordered    predniSONE (STERAPRED UNI-PAK 21 TAB) 10 MG (21) TBPK tablet  Daily        09/24/21 2120    oxyCODONE-acetaminophen (PERCOCET/ROXICET) 5-325 MG tablet  Every 6 hours PRN         09/24/21 2120              Ladislaus Repsher A, PA-C 09/24/21 2309    Sloan Leiter, DO 09/25/21 2359

## 2021-09-24 NOTE — Discharge Instructions (Addendum)
As discussed earlier your MRI showed a small disc herniation which is pressing on the nerves on the left which is likely the cause of your pain.  We have written for a short course of some steroids as well as a short course of some pain medicine.  He may take every 4-6 hours as needed for pain.  Do not take additional narcotic pain medicine with this such as tramadol.  This also contains Tylenol so do not take additional Tylenol as well.  Make sure to follow-up with [facility]  Return for new or worsening symptoms

## 2021-10-03 DIAGNOSIS — M5126 Other intervertebral disc displacement, lumbar region: Secondary | ICD-10-CM | POA: Diagnosis not present

## 2021-10-03 DIAGNOSIS — Z6829 Body mass index (BMI) 29.0-29.9, adult: Secondary | ICD-10-CM | POA: Diagnosis not present

## 2021-10-13 ENCOUNTER — Other Ambulatory Visit: Payer: BC Managed Care – PPO

## 2021-10-24 DIAGNOSIS — M5136 Other intervertebral disc degeneration, lumbar region: Secondary | ICD-10-CM | POA: Diagnosis not present

## 2021-10-24 DIAGNOSIS — M5126 Other intervertebral disc displacement, lumbar region: Secondary | ICD-10-CM | POA: Diagnosis not present

## 2021-12-26 DIAGNOSIS — M25512 Pain in left shoulder: Secondary | ICD-10-CM | POA: Diagnosis not present

## 2021-12-26 DIAGNOSIS — Z6830 Body mass index (BMI) 30.0-30.9, adult: Secondary | ICD-10-CM | POA: Diagnosis not present

## 2021-12-26 DIAGNOSIS — G8929 Other chronic pain: Secondary | ICD-10-CM | POA: Diagnosis not present

## 2022-01-02 DIAGNOSIS — M5416 Radiculopathy, lumbar region: Secondary | ICD-10-CM | POA: Diagnosis not present

## 2022-01-05 DIAGNOSIS — M25512 Pain in left shoulder: Secondary | ICD-10-CM | POA: Diagnosis not present

## 2022-01-05 DIAGNOSIS — M5126 Other intervertebral disc displacement, lumbar region: Secondary | ICD-10-CM | POA: Diagnosis not present

## 2022-01-11 DIAGNOSIS — Z7689 Persons encountering health services in other specified circumstances: Secondary | ICD-10-CM | POA: Diagnosis not present

## 2022-01-11 DIAGNOSIS — M25512 Pain in left shoulder: Secondary | ICD-10-CM | POA: Diagnosis not present

## 2022-01-12 DIAGNOSIS — M5416 Radiculopathy, lumbar region: Secondary | ICD-10-CM | POA: Diagnosis not present

## 2022-02-02 DIAGNOSIS — M25512 Pain in left shoulder: Secondary | ICD-10-CM | POA: Diagnosis not present

## 2022-02-13 DIAGNOSIS — M7552 Bursitis of left shoulder: Secondary | ICD-10-CM | POA: Diagnosis not present

## 2022-02-14 DIAGNOSIS — M25512 Pain in left shoulder: Secondary | ICD-10-CM | POA: Diagnosis not present

## 2022-02-24 DIAGNOSIS — M25512 Pain in left shoulder: Secondary | ICD-10-CM | POA: Diagnosis not present

## 2022-03-03 DIAGNOSIS — M25512 Pain in left shoulder: Secondary | ICD-10-CM | POA: Diagnosis not present

## 2022-03-10 DIAGNOSIS — M25512 Pain in left shoulder: Secondary | ICD-10-CM | POA: Diagnosis not present

## 2022-03-20 DIAGNOSIS — M7552 Bursitis of left shoulder: Secondary | ICD-10-CM | POA: Diagnosis not present

## 2022-03-20 DIAGNOSIS — M7542 Impingement syndrome of left shoulder: Secondary | ICD-10-CM | POA: Diagnosis not present

## 2022-03-20 DIAGNOSIS — M19012 Primary osteoarthritis, left shoulder: Secondary | ICD-10-CM | POA: Diagnosis not present

## 2022-03-20 DIAGNOSIS — S46012A Strain of muscle(s) and tendon(s) of the rotator cuff of left shoulder, initial encounter: Secondary | ICD-10-CM | POA: Diagnosis not present

## 2022-03-20 DIAGNOSIS — G8918 Other acute postprocedural pain: Secondary | ICD-10-CM | POA: Diagnosis not present

## 2022-03-20 DIAGNOSIS — S43432A Superior glenoid labrum lesion of left shoulder, initial encounter: Secondary | ICD-10-CM | POA: Diagnosis not present

## 2022-03-20 DIAGNOSIS — M24112 Other articular cartilage disorders, left shoulder: Secondary | ICD-10-CM | POA: Diagnosis not present

## 2022-03-20 DIAGNOSIS — S46211A Strain of muscle, fascia and tendon of other parts of biceps, right arm, initial encounter: Secondary | ICD-10-CM | POA: Diagnosis not present

## 2022-03-20 DIAGNOSIS — Y999 Unspecified external cause status: Secondary | ICD-10-CM | POA: Diagnosis not present

## 2022-03-20 DIAGNOSIS — M7522 Bicipital tendinitis, left shoulder: Secondary | ICD-10-CM | POA: Diagnosis not present

## 2022-03-20 DIAGNOSIS — M75122 Complete rotator cuff tear or rupture of left shoulder, not specified as traumatic: Secondary | ICD-10-CM | POA: Diagnosis not present

## 2022-03-20 DIAGNOSIS — X58XXXA Exposure to other specified factors, initial encounter: Secondary | ICD-10-CM | POA: Diagnosis not present

## 2022-03-24 DIAGNOSIS — M7552 Bursitis of left shoulder: Secondary | ICD-10-CM | POA: Diagnosis not present

## 2022-03-28 DIAGNOSIS — M7552 Bursitis of left shoulder: Secondary | ICD-10-CM | POA: Diagnosis not present

## 2022-04-19 DIAGNOSIS — M7552 Bursitis of left shoulder: Secondary | ICD-10-CM | POA: Diagnosis not present

## 2022-05-02 DIAGNOSIS — M7552 Bursitis of left shoulder: Secondary | ICD-10-CM | POA: Diagnosis not present

## 2022-05-04 DIAGNOSIS — M7552 Bursitis of left shoulder: Secondary | ICD-10-CM | POA: Diagnosis not present

## 2022-05-08 DIAGNOSIS — M7552 Bursitis of left shoulder: Secondary | ICD-10-CM | POA: Diagnosis not present

## 2022-05-10 DIAGNOSIS — M7552 Bursitis of left shoulder: Secondary | ICD-10-CM | POA: Diagnosis not present

## 2022-05-15 DIAGNOSIS — M7552 Bursitis of left shoulder: Secondary | ICD-10-CM | POA: Diagnosis not present

## 2022-05-17 DIAGNOSIS — M7552 Bursitis of left shoulder: Secondary | ICD-10-CM | POA: Diagnosis not present

## 2022-05-23 DIAGNOSIS — M7552 Bursitis of left shoulder: Secondary | ICD-10-CM | POA: Diagnosis not present

## 2022-05-30 DIAGNOSIS — M7552 Bursitis of left shoulder: Secondary | ICD-10-CM | POA: Diagnosis not present

## 2022-06-02 DIAGNOSIS — M7552 Bursitis of left shoulder: Secondary | ICD-10-CM | POA: Diagnosis not present

## 2022-06-05 DIAGNOSIS — M7552 Bursitis of left shoulder: Secondary | ICD-10-CM | POA: Diagnosis not present

## 2022-06-07 DIAGNOSIS — M7552 Bursitis of left shoulder: Secondary | ICD-10-CM | POA: Diagnosis not present

## 2022-06-14 DIAGNOSIS — M7552 Bursitis of left shoulder: Secondary | ICD-10-CM | POA: Diagnosis not present

## 2022-06-20 DIAGNOSIS — M7552 Bursitis of left shoulder: Secondary | ICD-10-CM | POA: Diagnosis not present

## 2022-07-11 DIAGNOSIS — M7552 Bursitis of left shoulder: Secondary | ICD-10-CM | POA: Diagnosis not present

## 2022-08-01 DIAGNOSIS — M7552 Bursitis of left shoulder: Secondary | ICD-10-CM | POA: Diagnosis not present

## 2022-08-03 DIAGNOSIS — H2513 Age-related nuclear cataract, bilateral: Secondary | ICD-10-CM | POA: Diagnosis not present

## 2022-08-03 DIAGNOSIS — H35371 Puckering of macula, right eye: Secondary | ICD-10-CM | POA: Diagnosis not present

## 2022-09-12 DIAGNOSIS — Z0000 Encounter for general adult medical examination without abnormal findings: Secondary | ICD-10-CM | POA: Diagnosis not present

## 2022-09-12 DIAGNOSIS — E7800 Pure hypercholesterolemia, unspecified: Secondary | ICD-10-CM | POA: Diagnosis not present

## 2022-09-12 DIAGNOSIS — Z23 Encounter for immunization: Secondary | ICD-10-CM | POA: Diagnosis not present

## 2022-09-12 DIAGNOSIS — N200 Calculus of kidney: Secondary | ICD-10-CM | POA: Diagnosis not present

## 2022-09-12 DIAGNOSIS — M519 Unspecified thoracic, thoracolumbar and lumbosacral intervertebral disc disorder: Secondary | ICD-10-CM | POA: Diagnosis not present

## 2022-09-12 DIAGNOSIS — Z125 Encounter for screening for malignant neoplasm of prostate: Secondary | ICD-10-CM | POA: Diagnosis not present

## 2022-09-12 DIAGNOSIS — F17210 Nicotine dependence, cigarettes, uncomplicated: Secondary | ICD-10-CM | POA: Diagnosis not present

## 2022-09-13 ENCOUNTER — Other Ambulatory Visit: Payer: Self-pay | Admitting: Internal Medicine

## 2022-09-13 DIAGNOSIS — F17210 Nicotine dependence, cigarettes, uncomplicated: Secondary | ICD-10-CM

## 2022-09-22 ENCOUNTER — Ambulatory Visit
Admission: RE | Admit: 2022-09-22 | Discharge: 2022-09-22 | Disposition: A | Discharge status: Home / Self Care | Payer: BC Managed Care – PPO | Source: Ambulatory Visit | Attending: Internal Medicine | Admitting: Internal Medicine

## 2022-09-22 DIAGNOSIS — F17210 Nicotine dependence, cigarettes, uncomplicated: Secondary | ICD-10-CM

## 2022-12-26 DIAGNOSIS — D120 Benign neoplasm of cecum: Secondary | ICD-10-CM | POA: Diagnosis not present

## 2022-12-26 DIAGNOSIS — Z1211 Encounter for screening for malignant neoplasm of colon: Secondary | ICD-10-CM | POA: Diagnosis not present

## 2023-08-07 DIAGNOSIS — H5203 Hypermetropia, bilateral: Secondary | ICD-10-CM | POA: Diagnosis not present

## 2023-08-07 DIAGNOSIS — H524 Presbyopia: Secondary | ICD-10-CM | POA: Diagnosis not present

## 2023-08-07 DIAGNOSIS — H2513 Age-related nuclear cataract, bilateral: Secondary | ICD-10-CM | POA: Diagnosis not present

## 2023-08-07 DIAGNOSIS — H52223 Regular astigmatism, bilateral: Secondary | ICD-10-CM | POA: Diagnosis not present

## 2023-08-07 DIAGNOSIS — H35371 Puckering of macula, right eye: Secondary | ICD-10-CM | POA: Diagnosis not present

## 2023-09-18 DIAGNOSIS — M7552 Bursitis of left shoulder: Secondary | ICD-10-CM | POA: Diagnosis not present

## 2023-09-19 DIAGNOSIS — M519 Unspecified thoracic, thoracolumbar and lumbosacral intervertebral disc disorder: Secondary | ICD-10-CM | POA: Diagnosis not present

## 2023-09-19 DIAGNOSIS — I700 Atherosclerosis of aorta: Secondary | ICD-10-CM | POA: Diagnosis not present

## 2023-09-19 DIAGNOSIS — J439 Emphysema, unspecified: Secondary | ICD-10-CM | POA: Diagnosis not present

## 2023-09-19 DIAGNOSIS — Z0000 Encounter for general adult medical examination without abnormal findings: Secondary | ICD-10-CM | POA: Diagnosis not present

## 2023-09-19 DIAGNOSIS — E7800 Pure hypercholesterolemia, unspecified: Secondary | ICD-10-CM | POA: Diagnosis not present

## 2023-09-21 DIAGNOSIS — K063 Horizontal alveolar bone loss: Secondary | ICD-10-CM | POA: Diagnosis not present
